# Patient Record
Sex: Male | Born: 1970 | Hispanic: Yes | Marital: Married | Smoking: Never smoker
Health system: Southern US, Community
[De-identification: ages and names within clinical notes are randomized; demographics above are authoritative.]

## PROBLEM LIST (undated history)

## (undated) DIAGNOSIS — N289 Disorder of kidney and ureter, unspecified: Secondary | ICD-10-CM

## (undated) DIAGNOSIS — I10 Essential (primary) hypertension: Secondary | ICD-10-CM

## (undated) DIAGNOSIS — E7800 Pure hypercholesterolemia, unspecified: Secondary | ICD-10-CM

---

## 2011-02-24 ENCOUNTER — Emergency Department (HOSPITAL_COMMUNITY)
Admission: EM | Admit: 2011-02-24 | Discharge: 2011-02-24 | Disposition: A | Discharge status: Home / Self Care | Payer: BC Managed Care – PPO | Attending: Emergency Medicine | Admitting: Emergency Medicine

## 2011-02-24 ENCOUNTER — Emergency Department (HOSPITAL_COMMUNITY): Payer: BC Managed Care – PPO

## 2011-02-24 DIAGNOSIS — R319 Hematuria, unspecified: Secondary | ICD-10-CM | POA: Insufficient documentation

## 2011-02-24 DIAGNOSIS — R51 Headache: Secondary | ICD-10-CM | POA: Insufficient documentation

## 2011-02-24 DIAGNOSIS — E119 Type 2 diabetes mellitus without complications: Secondary | ICD-10-CM | POA: Insufficient documentation

## 2011-02-24 DIAGNOSIS — R109 Unspecified abdominal pain: Secondary | ICD-10-CM | POA: Insufficient documentation

## 2011-02-24 DIAGNOSIS — R079 Chest pain, unspecified: Secondary | ICD-10-CM | POA: Insufficient documentation

## 2011-02-24 LAB — COMPREHENSIVE METABOLIC PANEL
ALT: 10 U/L (ref 0–53)
AST: 15 U/L (ref 0–37)
Alkaline Phosphatase: 77 U/L (ref 39–117)
GFR calc Af Amer: >60 mL/min (ref >60)
Glucose, Bld: 321 mg/dL — ABNORMAL HIGH (ref 70–99)
Potassium: 4.3 mEq/L (ref 3.5–5.1)
Sodium: 137 mEq/L (ref 135–145)
Total Protein: 6.7 g/dL (ref 6.0–8.3)

## 2011-02-24 LAB — CBC
HCT: 32.3 % — ABNORMAL LOW (ref 39.0–52.0)
MCHC: 36.5 g/dL — ABNORMAL HIGH (ref 30.0–36.0)
MCV: 82.4 fL (ref 78.0–100.0)
RDW: 12.9 % (ref 11.5–15.5)

## 2011-02-24 LAB — DIFFERENTIAL
Eosinophils Relative: 1 % (ref 0–5)
Lymphocytes Relative: 34 % (ref 12–46)
Lymphs Abs: 1.9 10*3/uL (ref 0.7–4.0)
Monocytes Absolute: 0.3 10*3/uL (ref 0.1–1.0)

## 2011-02-24 LAB — URINALYSIS, ROUTINE W REFLEX MICROSCOPIC
Bilirubin Urine: NEGATIVE
Protein, ur: >300 mg/dL — AB
Specific Gravity, Urine: 1.025 (ref 1.005–1.030)
Urobilinogen, UA: 0.2 mg/dL (ref 0.0–1.0)

## 2011-02-24 LAB — URINE MICROSCOPIC-ADD ON

## 2011-02-25 LAB — URINE CULTURE: Culture: NO GROWTH

## 2011-06-08 ENCOUNTER — Emergency Department (HOSPITAL_COMMUNITY): Payer: BC Managed Care – PPO

## 2011-06-08 ENCOUNTER — Emergency Department (HOSPITAL_COMMUNITY)
Admission: EM | Admit: 2011-06-08 | Discharge: 2011-06-08 | Disposition: A | Discharge status: Home / Self Care | Payer: BC Managed Care – PPO | Attending: Emergency Medicine | Admitting: Emergency Medicine

## 2011-06-08 DIAGNOSIS — R109 Unspecified abdominal pain: Secondary | ICD-10-CM | POA: Insufficient documentation

## 2011-06-08 DIAGNOSIS — Z79899 Other long term (current) drug therapy: Secondary | ICD-10-CM | POA: Insufficient documentation

## 2011-06-08 DIAGNOSIS — R52 Pain, unspecified: Secondary | ICD-10-CM | POA: Insufficient documentation

## 2011-06-08 DIAGNOSIS — E119 Type 2 diabetes mellitus without complications: Secondary | ICD-10-CM | POA: Insufficient documentation

## 2011-06-08 DIAGNOSIS — I10 Essential (primary) hypertension: Secondary | ICD-10-CM | POA: Insufficient documentation

## 2011-06-08 DIAGNOSIS — R197 Diarrhea, unspecified: Secondary | ICD-10-CM | POA: Insufficient documentation

## 2011-06-08 LAB — URINALYSIS, ROUTINE W REFLEX MICROSCOPIC
Bilirubin Urine: NEGATIVE
Glucose, UA: 250 mg/dL — AB
Protein, ur: >300 mg/dL — AB
Specific Gravity, Urine: 1.025 (ref 1.005–1.030)
Urobilinogen, UA: 0.2 mg/dL (ref 0.0–1.0)

## 2011-06-08 LAB — COMPREHENSIVE METABOLIC PANEL
ALT: 5 U/L (ref 0–53)
AST: 15 U/L (ref 0–37)
Alkaline Phosphatase: 78 U/L (ref 39–117)
CO2: 29 mEq/L (ref 19–32)
GFR calc Af Amer: >60 mL/min (ref >60)
GFR calc non Af Amer: 50 mL/min — ABNORMAL LOW (ref >60)
Glucose, Bld: 253 mg/dL — ABNORMAL HIGH (ref 70–99)
Potassium: 3.7 mEq/L (ref 3.5–5.1)
Sodium: 137 mEq/L (ref 135–145)

## 2011-06-08 LAB — CBC
Hemoglobin: 10.3 g/dL — ABNORMAL LOW (ref 13.0–17.0)
Platelets: 181 10*3/uL (ref 150–400)
RBC: 3.44 MIL/uL — ABNORMAL LOW (ref 4.22–5.81)
WBC: 6.1 10*3/uL (ref 4.0–10.5)

## 2011-06-08 LAB — URINE MICROSCOPIC-ADD ON

## 2011-06-08 LAB — DIFFERENTIAL
Basophils Absolute: 0 10*3/uL (ref 0.0–0.1)
Basophils Relative: 1 % (ref 0–1)
Eosinophils Absolute: 0.1 10*3/uL (ref 0.0–0.7)
Monocytes Relative: 5 % (ref 3–12)
Neutro Abs: 3.6 10*3/uL (ref 1.7–7.7)
Neutrophils Relative %: 59 % (ref 43–77)

## 2011-06-08 LAB — GLUCOSE, CAPILLARY: Glucose-Capillary: 249 mg/dL — ABNORMAL HIGH (ref 70–99)

## 2011-06-10 LAB — URINE CULTURE

## 2011-10-05 ENCOUNTER — Encounter: Payer: Self-pay | Admitting: *Deleted

## 2011-10-05 ENCOUNTER — Emergency Department (HOSPITAL_COMMUNITY): Payer: BC Managed Care – PPO

## 2011-10-05 ENCOUNTER — Emergency Department (HOSPITAL_COMMUNITY)
Admission: EM | Admit: 2011-10-05 | Discharge: 2011-10-05 | Disposition: A | Discharge status: Home / Self Care | Payer: BC Managed Care – PPO | Attending: Emergency Medicine | Admitting: Emergency Medicine

## 2011-10-05 DIAGNOSIS — H547 Unspecified visual loss: Secondary | ICD-10-CM | POA: Insufficient documentation

## 2011-10-05 DIAGNOSIS — M7989 Other specified soft tissue disorders: Secondary | ICD-10-CM | POA: Insufficient documentation

## 2011-10-05 DIAGNOSIS — R51 Headache: Secondary | ICD-10-CM | POA: Insufficient documentation

## 2011-10-05 DIAGNOSIS — R079 Chest pain, unspecified: Secondary | ICD-10-CM | POA: Insufficient documentation

## 2011-10-05 DIAGNOSIS — E119 Type 2 diabetes mellitus without complications: Secondary | ICD-10-CM | POA: Insufficient documentation

## 2011-10-05 DIAGNOSIS — D649 Anemia, unspecified: Secondary | ICD-10-CM | POA: Insufficient documentation

## 2011-10-05 DIAGNOSIS — N289 Disorder of kidney and ureter, unspecified: Secondary | ICD-10-CM | POA: Insufficient documentation

## 2011-10-05 DIAGNOSIS — I10 Essential (primary) hypertension: Secondary | ICD-10-CM | POA: Insufficient documentation

## 2011-10-05 DIAGNOSIS — E7800 Pure hypercholesterolemia, unspecified: Secondary | ICD-10-CM | POA: Insufficient documentation

## 2011-10-05 HISTORY — DX: Essential (primary) hypertension: I10

## 2011-10-05 HISTORY — DX: Disorder of kidney and ureter, unspecified: N28.9

## 2011-10-05 HISTORY — DX: Pure hypercholesterolemia, unspecified: E78.00

## 2011-10-05 LAB — COMPREHENSIVE METABOLIC PANEL
Alkaline Phosphatase: 72 U/L (ref 39–117)
BUN: 25 mg/dL — ABNORMAL HIGH (ref 6–23)
CO2: 25 mEq/L (ref 19–32)
Chloride: 107 mEq/L (ref 96–112)
Creatinine, Ser: 1.8 mg/dL — ABNORMAL HIGH (ref 0.50–1.35)
GFR calc Af Amer: 53 mL/min — ABNORMAL LOW (ref >90)
GFR calc non Af Amer: 46 mL/min — ABNORMAL LOW (ref >90)
Glucose, Bld: 145 mg/dL — ABNORMAL HIGH (ref 70–99)
Potassium: 4 mEq/L (ref 3.5–5.1)
Total Bilirubin: 0.3 mg/dL (ref 0.3–1.2)

## 2011-10-05 LAB — CBC
HCT: 23.4 % — ABNORMAL LOW (ref 39.0–52.0)
Hemoglobin: 8.3 g/dL — ABNORMAL LOW (ref 13.0–17.0)
MCV: 83.3 fL (ref 78.0–100.0)
WBC: 6.4 10*3/uL (ref 4.0–10.5)

## 2011-10-05 LAB — GLUCOSE, CAPILLARY

## 2011-10-05 MED ORDER — SODIUM CHLORIDE 0.9 % IV BOLUS (SEPSIS)
250.0000 mL | Freq: Once | INTRAVENOUS | Status: AC
  Administered 2011-10-05: 250 mL via INTRAVENOUS

## 2011-10-05 MED ORDER — SODIUM CHLORIDE 0.9 % IV SOLN
INTRAVENOUS | Status: DC
  Administered 2011-10-05: 17:00:00 via INTRAVENOUS

## 2011-10-05 MED ORDER — GADOBENATE DIMEGLUMINE 529 MG/ML IV SOLN
15.0000 mL | Freq: Once | INTRAVENOUS | Status: AC | PRN
  Administered 2011-10-05: 15 mL via INTRAVENOUS

## 2011-10-05 NOTE — ED Notes (Signed)
Pt reports bilateral leg swelling that started Saturday. Patient reports loss of vision in right eye and that he can only see red and blurred objects out of left eye. Patient in NAD at this time. Denies any needs at present.

## 2011-10-05 NOTE — ED Provider Notes (Addendum)
Scribed for Shelda Jakes, MD, the patient was seen in room APA03/APA03 . This chart was scribed by Ellie Lunch. This patient's care was started at 4:27 PM.   CSN: [id number] Arrival date & time: 10/05/2011  4:02 PM  Chief Complaint  Patient presents with  . visual disturbances   . Leg Swelling    (Consider location/radiation/quality/duration/timing/severity/associated sxs/prior treatment) A language interpreter was used.   Nathan Miles is a 40 y.o. male with a history of Diabetes who presents to the Emergency Department complaining of visual disturbances. Pt reports that he began to have trouble with his vision in his left eye beginning 4-5 months ago becoming progressively worse and resulting in total loss of vision in his left eye 2 days ago. Pt also reports starting 2 days ago his vision in his right eye became disturbed. He says he lost the ability to distinguish shapes and began to see red in his right eye. Pt says Sx are constant since onset, are severe, and are associated with frontal head pain. Pt reports that [facility] [place] [facility] time in 5/12 and was referred to an eye specialist in [place] where he received 2 laser surgeries to his left eye and 1 laser surgery to his right eye for possible diabetes related hemorrhage. His last visit to the specialist was 8/12.   Pt reports his diabetes has been under control recently. Today Pt saw his diabetes specialist Dr. Rayburn Felt this morning because he was concerned his vision loss was related to his DM. He was referred here to the ED for CT or MRI. He was also referred to an eye specialist.  Additionally reports welling in his legs for the past 2 weeks.  Past Medical History  Diagnosis Date  . Diabetes mellitus   . Hypertension   . High cholesterol   . Renal disorder     History reviewed. No pertinent past surgical history.  No family history on file.  History  Substance Use Topics  . Smoking  status: Never Smoker   . Smokeless tobacco: Not on file  . Alcohol Use: No    Review of Systems  Eyes: Positive for visual disturbance.  Cardiovascular: Positive for leg swelling.  Neurological: Positive for headaches.  All other systems reviewed and are negative.   Allergies  Review of patient's allergies indicates no known allergies.  Home Medications  No current outpatient prescriptions on file.  BP 133/81  Pulse 91  Temp(Src) 98.4 F (36.9 C) (Oral)  Resp 18  Ht 5\' 10"  (1.778 m)  Wt 160 lb (72.576 kg)  BMI 22.96 kg/m2  SpO2 100%  Physical Exam  Nursing note and vitals reviewed. Constitutional: He is oriented to person, place, and time. He appears well-developed and well-nourished.  HENT:  Head: Normocephalic and atraumatic.  Eyes:       Pupils 3mm in size. No reaction to light. Anterior chambers appear good. Able to track finger okay.  Not able to see any to visual any vessels.  No vision in left eye.   Neck: Neck supple.       No carotid bruit  Cardiovascular: Normal rate, regular rhythm, normal heart sounds and intact distal pulses.        Radial pulse 2+  Pulmonary/Chest: Effort normal and breath sounds normal.  Abdominal: Soft. There is no tenderness.  Musculoskeletal:       Minimal swelling in legs. No pitting edema. Pedal pulse 2+  Neurological: He is  alert and oriented to person, place, and time. Coordination normal.  Skin: Skin is warm and dry.   Procedures (including critical care time) OTHER DATA REVIEWED: Nursing notes, vital signs, and past medical records reviewed.  DIAGNOSTIC STUDIES: Oxygen Saturation is 100% on room air, normal by my interpretation.    LABS / RADIOLOGY:  Results for orders placed during the hospital encounter of 10/05/11  GLUCOSE, CAPILLARY      Component Value Range   Glucose-Capillary 156 (*) 70 - 99 (mg/dL)   Comment 1 Notify RN    CBC      Component Value Range   WBC 6.4  4.0 - 10.5 (K/uL)   RBC 2.81 (*) 4.22 -  5.81 (MIL/uL)   Hemoglobin 8.3 (*) 13.0 - 17.0 (g/dL)   HCT 21.3 (*) 08.6 - 52.0 (%)   MCV 83.3  78.0 - 100.0 (fL)   MCH 29.5  26.0 - 34.0 (pg)   MCHC 35.5  30.0 - 36.0 (g/dL)   RDW 57.8  46.9 - 62.9 (%)   Platelets 173  150 - 400 (K/uL)  COMPREHENSIVE METABOLIC PANEL      Component Value Range   Sodium 141  135 - 145 (mEq/L)   Potassium 4.0  3.5 - 5.1 (mEq/L)   Chloride 107  96 - 112 (mEq/L)   CO2 25  19 - 32 (mEq/L)   Glucose, Bld 145 (*) 70 - 99 (mg/dL)   BUN 25 (*) 6 - 23 (mg/dL)   Creatinine, Ser 5.28 (*) 0.50 - 1.35 (mg/dL)   Calcium 8.9  8.4 - 41.3 (mg/dL)   Total Protein 7.1  6.0 - 8.3 (g/dL)   Albumin 3.4 (*) 3.5 - 5.2 (g/dL)   AST 19  0 - 37 (U/L)   ALT 7  0 - 53 (U/L)   Alkaline Phosphatase 72  39 - 117 (U/L)   Total Bilirubin 0.3  0.3 - 1.2 (mg/dL)   GFR calc non Af Amer 46 (*) >90 (mL/min)   GFR calc Af Amer 53 (*) >90 (mL/min)   Dg Chest 2 View  10/05/2011  *RADIOLOGY REPORT*  Clinical Data: Shortness of breath, leg swelling.  CHEST - 2 VIEW  Comparison: 02/24/2011  Findings: Heart and mediastinal contours are within normal limits. No focal opacities or effusions.  No acute bony abnormality.  IMPRESSION: No active cardiopulmonary disease.  Original Report Authenticated By: Cyndie Chime, M.D.   Mr Laqueta Jean Wo Contrast  10/05/2011  *RADIOLOGY REPORT*  Clinical Data: Visual disturbance  MRI HEAD WITHOUT AND WITH CONTRAST  Technique:  Multiplanar, multiecho pulse sequences of the brain and surrounding structures were obtained according to standard protocol without and with intravenous contrast  Contrast: 15mL MULTIHANCE GADOBENATE DIMEGLUMINE 529 MG/ML IV SOLN  Comparison: None.  Findings: The examination suffers from considerable motion degradation.  Diffusion imaging does not show any acute or subacute infarction.  The brain appears normal on the motion degraded images.  No mass lesion, hemorrhage, hydrocephalus or extra-axial collection.  No pituitary mass.  Sinuses,  middle ears and mastoids are clear.  No abnormal contrast enhancement.  IMPRESSION: Motion degraded exam.  No pathologic finding.  Normal study allowing for motion.  Original Report Authenticated By: Thomasenia Sales, M.D.    ED COURSE / COORDINATION OF CARE: MDM:   SEEN BY HIS PROVIDER TODAY AND ARRANGEMENT MADE BY DR NIDA FOR EYE FOLLOW UP TOMORROW. HE HAS BEEN FOLLOWED BY RETINAL SPECIALIST IN THE PAST FOR RETINAL HEMMORAGES. TODAY DR NIDA Javier Docker  MRI OF BRAIN THAT WAS DONE AND NO SIG FINDINGS. LAB WORK UP WITHOUT SIG CHANGES EX FOR SOME WORSENING OF ANEMIA. HB NOT YET READY FOR TRANSFUSION. I SUSPECT VISUAL CHANGES OVER THE PAST SEVERAL WEEKS  AND THE 3 DAY OLD VISION LOSS RELATED TO SAME.  THEY UNDERSTAND IMPORTANCE OF SEEING THE EYE SPECIALIST.   CONFIRMED AT DISCHARGE THAT HE HAS APPOINTMENT TOMORROW WITH THE RETINAL SPECIALIST.   MEDICATIONS GIVEN IN THE E.D.  Medications  0.9 %  sodium chloride infusion (  Intravenous New Bag 10/05/11 1716)  sodium chloride 0.9 % bolus 250 mL (250 mL Intravenous Given 10/05/11 1715)  gadobenate dimeglumine (MULTIHANCE) injection 15 mL (15 mL Intravenous Contrast Given 10/05/11 1803)    SCRIBE ATTESTATION:   I personally performed the services described in this documentation, which was scribed in my presence. The recorded information has been reviewed and considered.         Shelda Jakes, MD 10/05/11 2219  Shelda Jakes, MD 10/11/11 [phone]

## 2011-10-05 NOTE — ED Notes (Signed)
C/o loss of vision in left eye, states right eye sees red x 2 days.  C/o swelling to bil legs x 2 wks.

## 2011-10-05 NOTE — ED Notes (Signed)
Patient remains in MRI 

## 2011-10-05 NOTE — ED Notes (Signed)
Pt left the er stating no needs 

## 2011-11-27 ENCOUNTER — Inpatient Hospital Stay: Payer: Self-pay | Admitting: *Deleted

## 2011-12-09 ENCOUNTER — Ambulatory Visit: Payer: Self-pay | Admitting: Ophthalmology

## 2011-12-11 ENCOUNTER — Ambulatory Visit: Payer: Self-pay | Admitting: Ophthalmology

## 2011-12-11 ENCOUNTER — Emergency Department: Payer: Self-pay | Admitting: *Deleted

## 2012-02-05 IMAGING — CR DG CHEST 2V
1 series · 2 of 2 positions shown · non-contrast
Comparison: none

REASON FOR EXAM: pain
COMMENTS:   May transport without cardiac monitor

[Series 3: x chest ap · 0.14mm/px · 2 of 2 slices shown]
[im 1/2]
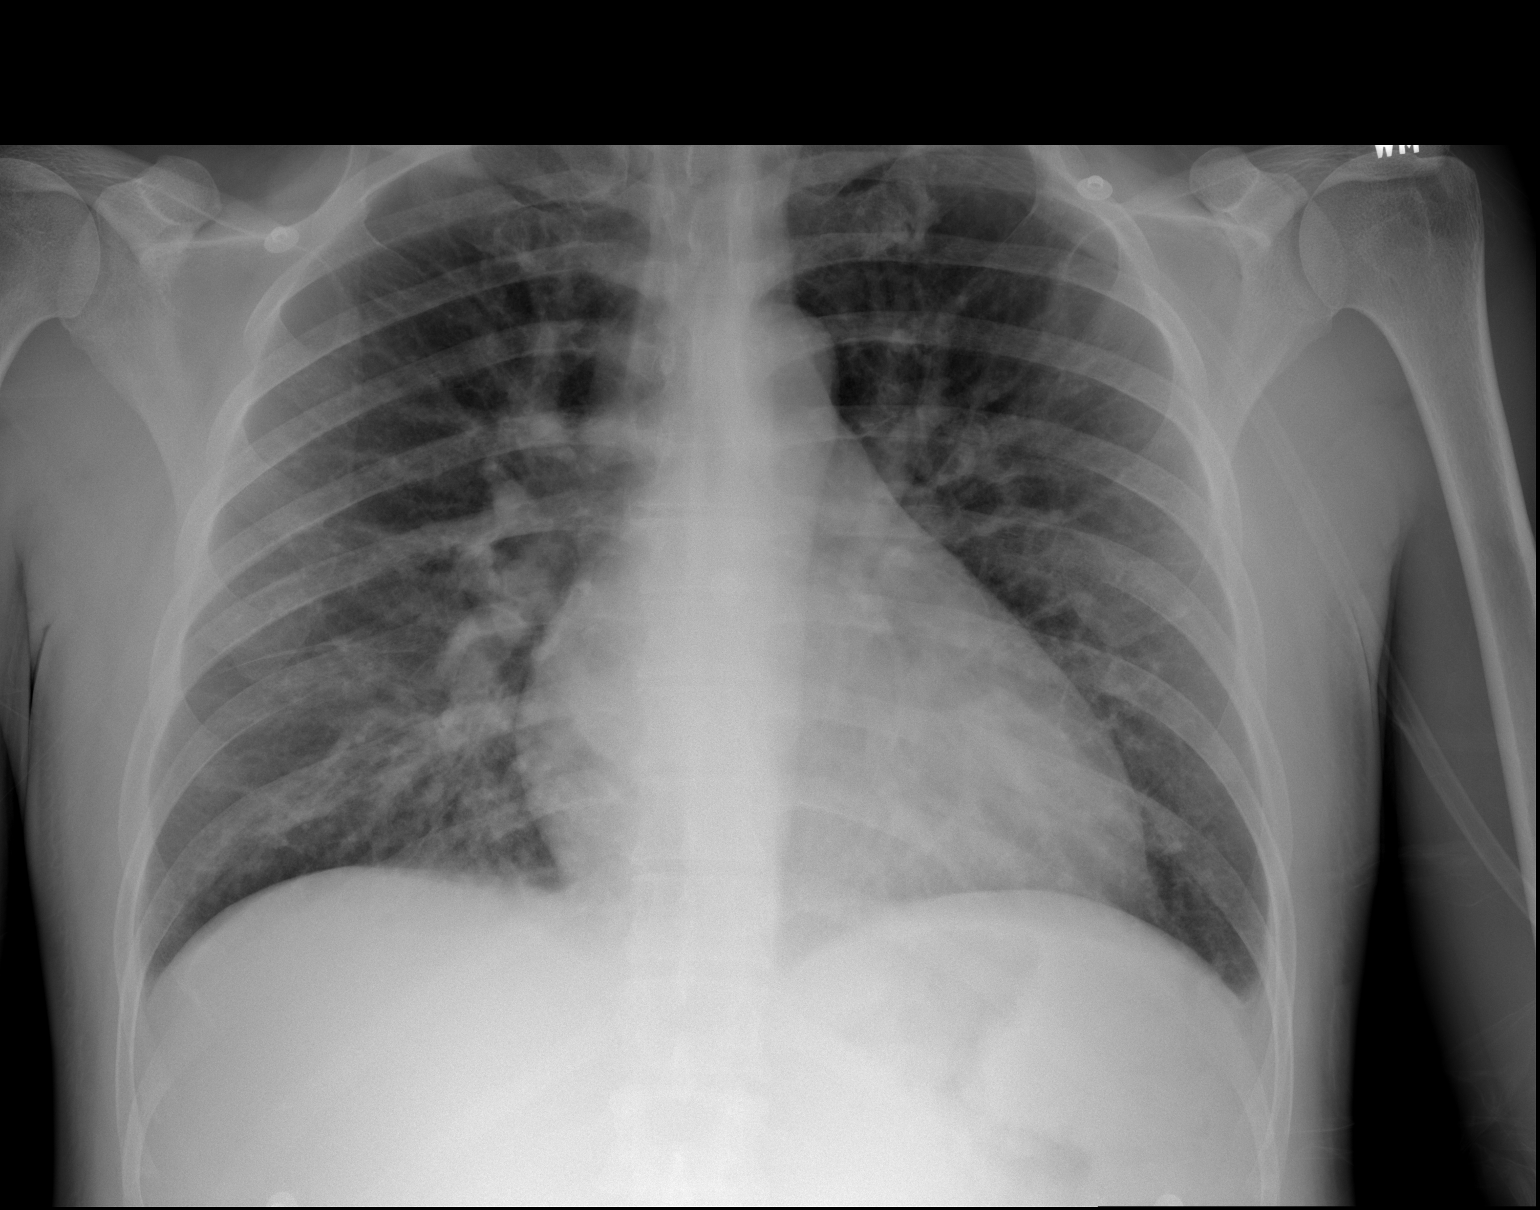
[im 2/2]
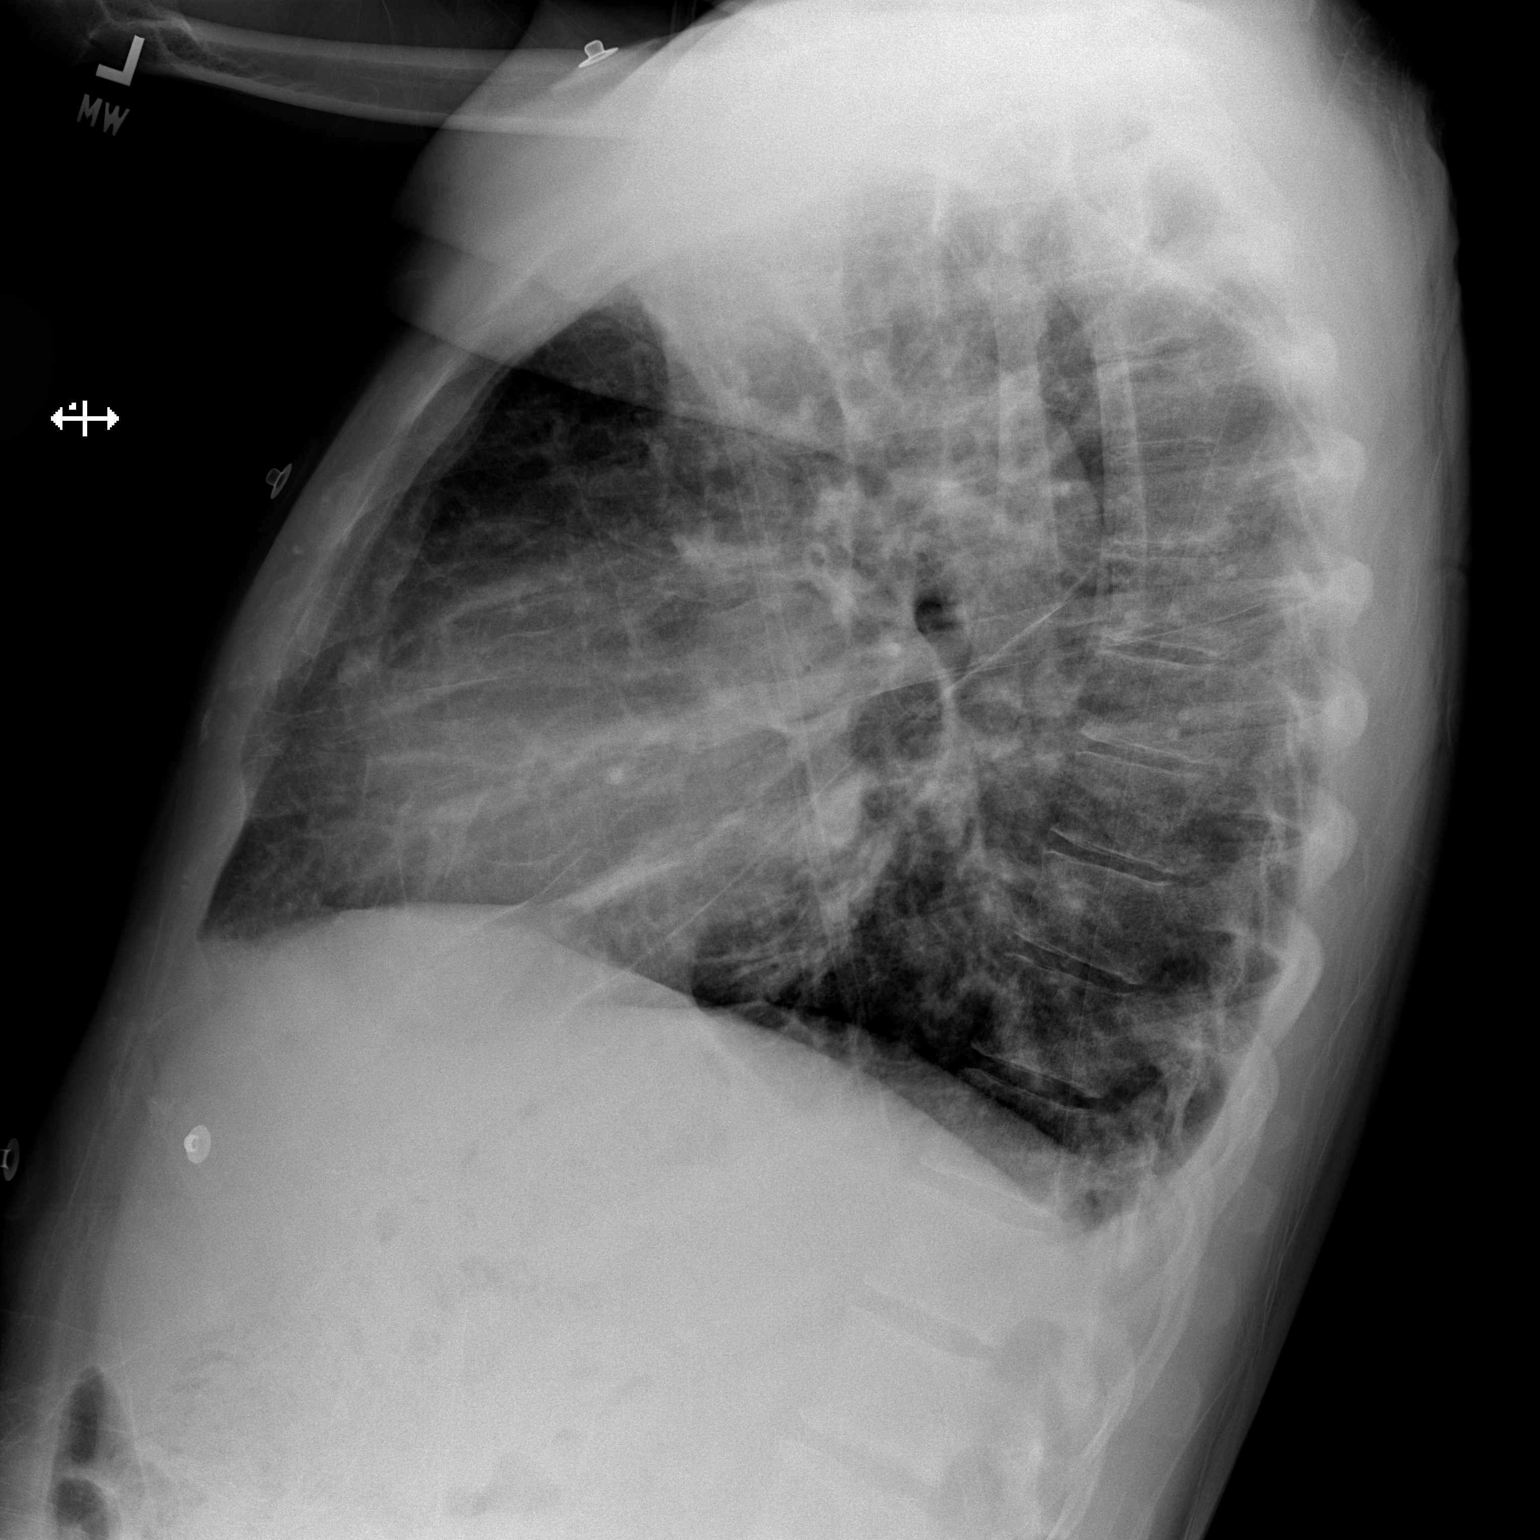

[2 of 2 positions shown; findings below may reference images not displayed]

PROCEDURE:     DXR - DXR CHEST PA (OR AP) AND LATERAL  - December 11, 2011  [DATE]

RESULT:     Comparison is made to the prior exam of 11/27/2011. No pneumonia
is seen. There is again noted prominence of the pulmonary vascularity
consistent with mild pulmonary vascular congestion. There is a trace of
inner lobar fluid. The heart is mildly enlarged. Early or low grade CHF
cannot be excluded. Note is made that noncardiac causes of pulmonary
vascular congestion such as diminished renal function, severe anemia or low
serum proteins could produce similar findings. The mediastinal and osseous
structures are normal in appearance. In the lateral view, the chest appears
hyperinflated which suggest a history of COPD or asthma.
IMPRESSION: 1. No pneumonia is seen.
2. There are changes compatible with pulmonary vascular congestion as noted
above.
3. In the lateral view, the chest appears hyperinflated which suggest a
history of COPD or asthma.

## 2012-02-25 ENCOUNTER — Inpatient Hospital Stay: Payer: Self-pay | Admitting: Internal Medicine

## 2012-02-25 LAB — URINALYSIS, COMPLETE
Bilirubin,UR: NEGATIVE
Glucose,UR: 50 mg/dL (ref 0–75)
Hyaline Cast: 1
Ketone: NEGATIVE
Leukocyte Esterase: NEGATIVE
Ph: 6 (ref 4.5–8.0)
Squamous Epithelial: <1

## 2012-02-25 LAB — TROPONIN I: Troponin-I: 0.06 ng/mL — ABNORMAL HIGH

## 2012-02-25 LAB — CBC
HCT: 24.8 % — ABNORMAL LOW (ref 40.0–52.0)
HGB: 8.1 g/dL — ABNORMAL LOW (ref 13.0–18.0)
MCV: 89 fL (ref 80–100)
RDW: 15.9 % — ABNORMAL HIGH (ref 11.5–14.5)
WBC: 6.6 10*3/uL (ref 3.8–10.6)

## 2012-02-25 LAB — COMPREHENSIVE METABOLIC PANEL
Albumin: 2.6 g/dL — ABNORMAL LOW (ref 3.4–5.0)
Alkaline Phosphatase: 63 U/L (ref 50–136)
Bilirubin,Total: 0.4 mg/dL (ref 0.2–1.0)
Calcium, Total: 8.2 mg/dL — ABNORMAL LOW (ref 8.5–10.1)
Co2: 20 mmol/L — ABNORMAL LOW (ref 21–32)
Creatinine: 2.57 mg/dL — ABNORMAL HIGH (ref 0.60–1.30)
EGFR (Non-African Amer.): 30 — ABNORMAL LOW
Glucose: 94 mg/dL (ref 65–99)
Potassium: 4.6 mmol/L (ref 3.5–5.1)
SGPT (ALT): 12 U/L

## 2012-02-25 LAB — LIPASE, BLOOD: Lipase: 102 U/L (ref 73–393)

## 2012-02-25 LAB — PROTIME-INR: Prothrombin Time: 14.4 secs (ref 11.5–14.7)

## 2012-02-25 LAB — CK TOTAL AND CKMB (NOT AT ARMC): CK-MB: 3.3 ng/mL (ref 0.5–3.6)

## 2012-02-26 LAB — BASIC METABOLIC PANEL
Anion Gap: 12 (ref 7–16)
BUN: 41 mg/dL — ABNORMAL HIGH (ref 7–18)
Calcium, Total: 8 mg/dL — ABNORMAL LOW (ref 8.5–10.1)
Creatinine: 3.12 mg/dL — ABNORMAL HIGH (ref 0.60–1.30)
EGFR (African American): 29 — ABNORMAL LOW
EGFR (Non-African Amer.): 24 — ABNORMAL LOW
Glucose: 106 mg/dL — ABNORMAL HIGH (ref 65–99)
Sodium: 144 mmol/L (ref 136–145)

## 2012-02-26 LAB — IRON AND TIBC
Iron Bind.Cap.(Total): 313 ug/dL (ref 250–450)
Iron Saturation: 11 %
Iron: 34 ug/dL — ABNORMAL LOW (ref 65–175)
Unbound Iron-Bind.Cap.: 279 ug/dL

## 2012-02-26 LAB — CBC WITH DIFFERENTIAL/PLATELET
Basophil #: 0.1 10*3/uL (ref 0.0–0.1)
Basophil %: 1 %
Eosinophil #: 0.1 10*3/uL (ref 0.0–0.7)
Lymphocyte %: 29.5 %
MCH: 30 pg (ref 26.0–34.0)
MCHC: 33 g/dL (ref 32.0–36.0)
Monocyte #: 0.3 10*3/uL (ref 0.0–0.7)
Neutrophil #: 3.3 10*3/uL (ref 1.4–6.5)
Neutrophil %: 61.4 %
RBC: 2.67 10*6/uL — ABNORMAL LOW (ref 4.40–5.90)
RDW: 17 % — ABNORMAL HIGH (ref 11.5–14.5)

## 2012-02-26 LAB — FERRITIN: Ferritin (ARMC): 152 ng/mL (ref 8–388)

## 2012-02-26 LAB — CK TOTAL AND CKMB (NOT AT ARMC)
CK, Total: 684 U/L — ABNORMAL HIGH (ref 35–232)
CK-MB: 2.6 ng/mL (ref 0.5–3.6)

## 2012-02-27 LAB — BASIC METABOLIC PANEL
Chloride: 109 mmol/L — ABNORMAL HIGH (ref 98–107)
Co2: 22 mmol/L (ref 21–32)
Creatinine: 3.17 mg/dL — ABNORMAL HIGH (ref 0.60–1.30)
EGFR (African American): 28 — ABNORMAL LOW
EGFR (Non-African Amer.): 23 — ABNORMAL LOW
Sodium: 144 mmol/L (ref 136–145)

## 2012-02-27 LAB — PROTEIN / CREATININE RATIO, URINE
Protein, Random Urine: 176 mg/dL — ABNORMAL HIGH (ref 0–12)
Protein/Creat. Ratio: 5192 mg/gCREAT — ABNORMAL HIGH (ref 0–200)

## 2012-02-29 LAB — UR PROT ELECTROPHORESIS, URINE RANDOM

## 2012-02-29 LAB — HEMOGLOBIN
HGB: 8.3 g/dL — ABNORMAL LOW (ref 13.0–18.0)
HGB: 7.5 g/dL — ABNORMAL LOW (ref 13.0–18.0)

## 2012-02-29 LAB — CREATININE, SERUM: EGFR (African American): 29 — ABNORMAL LOW

## 2012-03-01 LAB — CBC WITH DIFFERENTIAL/PLATELET
Basophil #: 0.1 10*3/uL (ref 0.0–0.1)
Eosinophil #: 0.2 10*3/uL (ref 0.0–0.7)
Eosinophil %: 2.3 %
HCT: 20.8 % — ABNORMAL LOW (ref 40.0–52.0)
Lymphocyte #: 1.5 10*3/uL (ref 1.0–3.6)
MCH: 29.8 pg (ref 26.0–34.0)
MCHC: 32.6 g/dL (ref 32.0–36.0)
MCV: 91 fL (ref 80–100)
Monocyte #: 0.4 10*3/uL (ref 0.0–0.7)
Monocyte %: 5.7 %
Neutrophil #: 4.7 10*3/uL (ref 1.4–6.5)
Platelet: 175 10*3/uL (ref 150–440)
RBC: 2.28 10*6/uL — ABNORMAL LOW (ref 4.40–5.90)
RDW: 16 % — ABNORMAL HIGH (ref 11.5–14.5)

## 2012-03-01 LAB — URINALYSIS, COMPLETE
Bilirubin,UR: NEGATIVE
Blood: NEGATIVE
Glucose,UR: 50 mg/dL (ref 0–75)
Hyaline Cast: 1
Leukocyte Esterase: NEGATIVE
Ph: 7 (ref 4.5–8.0)
Specific Gravity: 1.009 (ref 1.003–1.030)
Squamous Epithelial: NONE SEEN
WBC UR: 1 /HPF (ref 0–5)

## 2012-03-01 LAB — BASIC METABOLIC PANEL
Anion Gap: 5 — ABNORMAL LOW (ref 7–16)
BUN: 43 mg/dL — ABNORMAL HIGH (ref 7–18)
Chloride: 109 mmol/L — ABNORMAL HIGH (ref 98–107)
Creatinine: 3.41 mg/dL — ABNORMAL HIGH (ref 0.60–1.30)
EGFR (African American): 26 — ABNORMAL LOW
EGFR (Non-African Amer.): 22 — ABNORMAL LOW
Glucose: 136 mg/dL — ABNORMAL HIGH (ref 65–99)
Osmolality: 290 (ref 275–301)
Sodium: 139 mmol/L (ref 136–145)

## 2012-04-21 IMAGING — CR DG CHEST 2V
1 series · 2 of 2 positions shown · non-contrast
Comparison: none

REASON FOR EXAM: sob
COMMENTS:

PROCEDURE:     DXR - DXR CHEST PA (OR AP) AND LATERAL  - February 25, 2012  [DATE]
RESULT:     Comparison: 12/11/2011

[Series 1: w chest pa · 0.14mm/px · 2 of 2 slices shown]
[im 1/2]
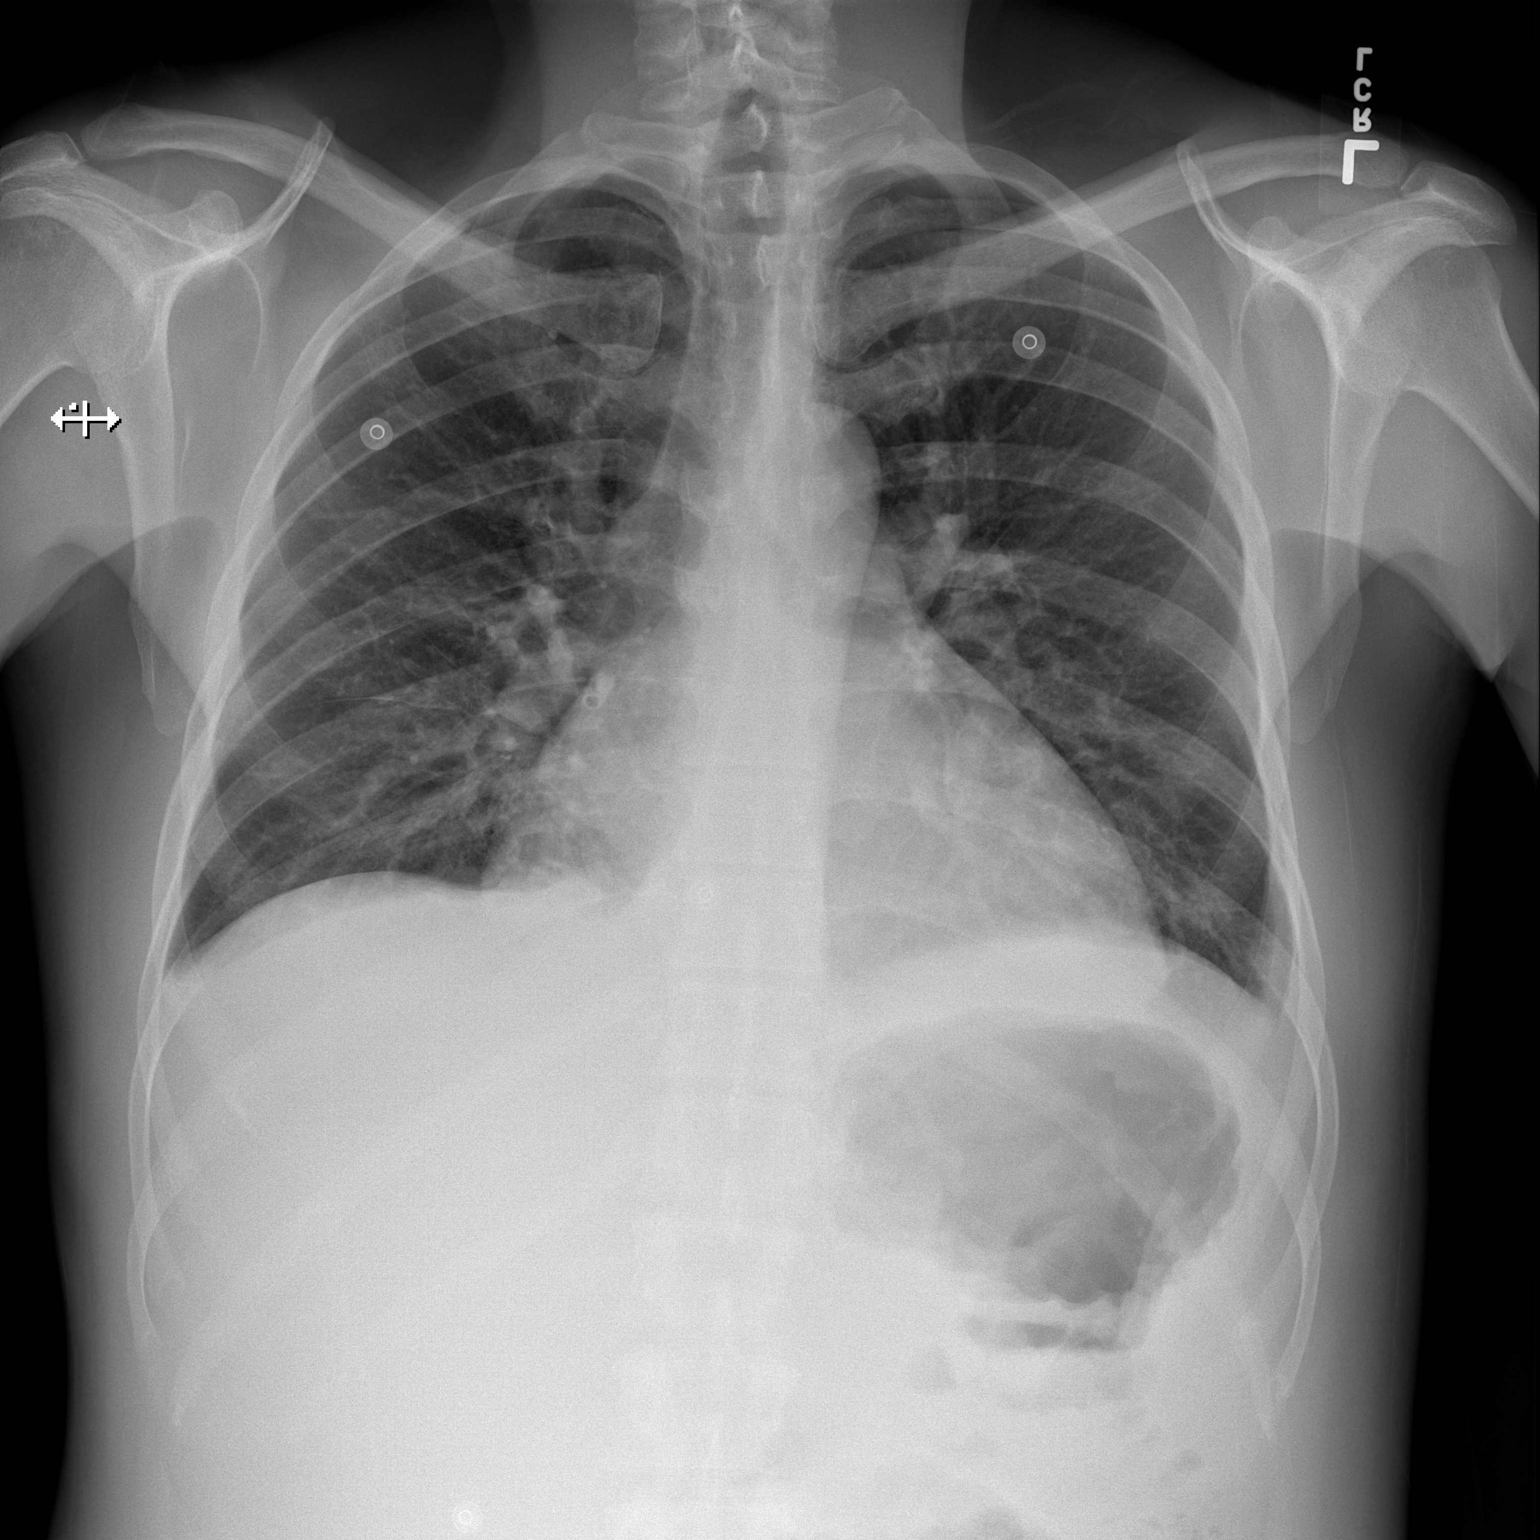
[im 2/2]
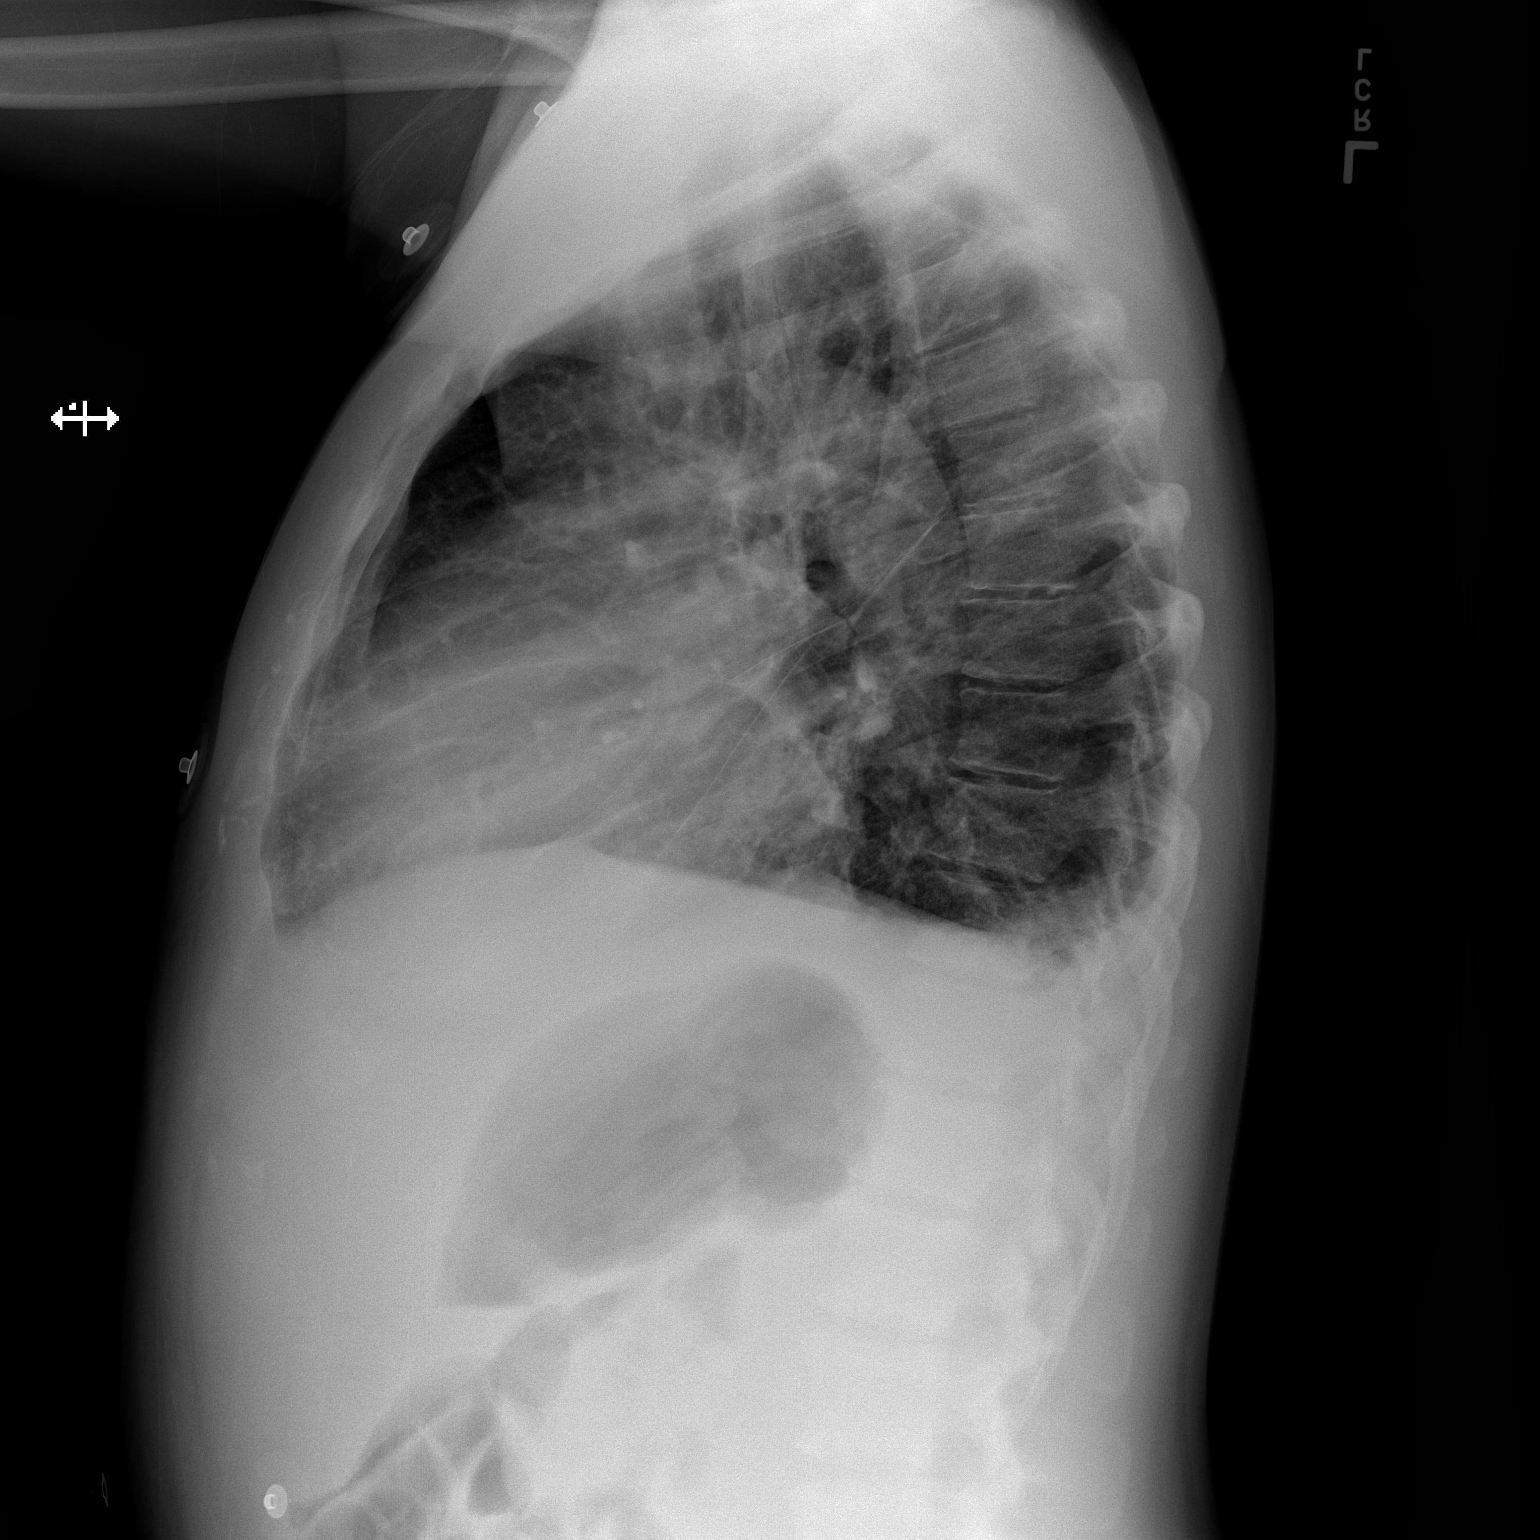

[2 of 2 positions shown; findings below may reference images not displayed]

FINDINGS: Heart size upper limits of normal to mildly enlarged, similar to prior.
There are mild bilateral interstitial opacities. Small bilateral pleural
effusions.
IMPRESSION: Mild interstitial pulmonary edema with small bilateral pleural effusions.

## 2015-04-14 NOTE — H&P (Signed)
PATIENT NAME:  Nathan Miles, Nathan Miles MR#:  [id number] DATE OF BIRTH:  [birth year]  DATE OF ADMISSION:  02/25/2012  PRIMARY CARE PHYSICIAN: None.  CHIEF COMPLAINT: Shortness of breath, lower extremity swelling and abdominal bloating.   HISTORY OF PRESENT ILLNESS: This is a 44 year old male who presents to the Emergency Room today due to progressive lower extremity swelling, abdominal bloating, and shortness of breath. The patient is strictly Spanish speaking, therefore, most of the history is obtained with the help of the interpreter at bedside. The patient apparently was discharged from the hospital a couple of months ago, in December 2012, and was treated for renal failure, some atypical chest pain, and hypertension. He was also noted to have some vision loss secondary to severe retinopathy. The patient said that over the past few weeks his lower extremities have gotten significantly swollen where it gets worse usually in the evenings and when he keeps his feet down. The patient also says that he has been having worsening exertional shortness of breath and has been getting up multiple times at night feeling short of breath. He cannot recall if he really has gained any weight. He also admits there is some atypical chest pain which comes along in the evenings usually when he is ambulating. He denies any fevers, denies any chills, and he denies any nausea and vomiting. A few weeks ago the patient had significant right eye pain and was being followed by Dr. Aron BabaMatthew Appenzeller from ophthalmology. 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 Postoperatively, the patient was still having significant right eye pain and [facility]. Postsurgery the patient was told not to take any of his blood pressure medications until he follows up with his primary care physician. He went to see his primary care physician but they would not see him and therefore he has  not been taking any of his blood pressure medications over the past month or so. He has had a follow-up [facility], which was a couple of days ago, and he seems to be doing fine from that standpoint presently. When the patient presented to the ER,  he was noted to be anasarcic and in congestive heart failure. Hospitalist services were then contacted for further treatment and evaluation.   REVIEW OF SYSTEMS: CONSTITUTIONAL: No documented fever. No weight gain and no weight loss. EYES: No blurry or double vision. ENT: No tinnitus or postnasal drip. No redness of the oropharynx. RESPIRATORY: No cough, no wheeze, and no hemoptysis. Positive dyspnea. CARDIOVASCULAR: Positive chest pain. Positive orthopnea. No palpitations or syncope. GI: No nausea, no vomiting, no diarrhea, no abdominal pain, and no melena or hematochezia. GU: No dysuria or hematuria. ENDOCRINE: No polyuria or nocturia. No heat or cold intolerance. HEME: No anemia, no bruising, and no bleeding. INTEGUMENTARY: No rashes and no lesions. MUSCULOSKELETAL: No arthritis, no swelling, and no gout. NEUROLOGIC: No numbness, no tingling, no ataxia, and no seizure activity. PSYCH: No anxiety, no insomnia, and no ADD.   PAST MEDICAL HISTORY:  1. 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 2. Chronic kidney disease. 3. Chronic anemia. 4. 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   ALLERGIES: No known drug allergies.   SOCIAL HISTORY: No smoking. No alcohol abuse. No illicit drug abuse. Lives at home with his wife.   FAMILY HISTORY: The patient's father died from a malignancy, suspected bone malignancy. He has  two brothers who are diabetic, although he is not diabetic.   CURRENT MEDICATIONS: He currently is only taking prednisolone eyedrops 1% four times daily.  PHYSICAL EXAMINATION:   VITAL SIGNS: Presently temperature is 97, pulse 85, respirations 14, blood pressure 181/119, and  saturations 97% on room air.   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   HEENT: Head atraumatic, normocephalic. His extraocular muscles are intact. His pupils are equal, round and reactive to light. Sclerae anicteric. No conjunctival injection. No pharyngeal erythema.   NECK: Supple. No jugular venous distention, no bruits, no lymphadenopathy, and no thyromegaly.   HEART: Regular rate and rhythm. No murmurs, no rubs, no clicks.   LUNGS: Clear to auscultation bilaterally. No rales, no rhonchi, and no wheezes.   ABDOMEN: Soft, flat, nontender, and nondistended. Good bowel sounds. No hepatosplenomegaly appreciated.   EXTREMITIES: He does have +2 to 3 pitting edema bilaterally from the knees to the ankles, +2 pedal and radial pulses bilaterally.   NEUROLOGIC: The patient is alert, awake, and oriented x3 with no focal motor or sensory deficits appreciated bilaterally.   SKIN: Moist and warm with no rash appreciated.   LYMPHATIC: There is no cervical or axillary lymphadenopathy.   LABS/STUDIES: Serum glucose 94, BUN 37, creatinine 2.5, sodium 143, potassium 4.6, chloride 110, bicarbonate 20. Liver function tests are within normal limits. Troponin 0.04. CK 689. White cell count 6.6, hemoglobin 8.1, hematocrit 44.8, platelet count 250. INR 1.1.   The patient did have an ultrasound of the abdomen done which shows mild sludge within the gallbladder, mild gallbladder wall thickening and pericholecystic fluid, but no sonographic Murphy's sign, bilateral pleural effusions, and a pericardial effusion, incompletely visualized.   ASSESSMENT AND PLAN: This is a 44 year old male with past medical history of nonischemic cardiomyopathy with an ejection fraction of 35 to 45%, narrow-angle glaucoma, CKD stage III, and chronic anemia who presented to the hospital due to shortness of breath, lower extremity edema, and also abdominal swelling. He was also noted to be in malignant  hypertension.  1. Acute congestive heart failure: This is likely the cause of the patient's shortness of breath and lower extremity edema. The patient apparently had not been taking any of his hypertensive medications plus he has an underlying cardiomyopathy and now here with malignant hypertension. I will aggressively diurese him with Lasix, start him on IV every 12 hours of 40 mg, and follow daily ins and outs and weights. We will follow his renal function closely as he has chronic kidney disease. We will start him on some beta blockers. We cannot give him an ACE inhibitor due to his chronic kidney disease. I will add some Norvasc, hydralazine, and nitroglycerin. We will get a cardiology consult. The patient is followed by Dr. Juliann Pares. I will go ahead and repeat a two-dimensional echocardiogram as there is some concern for possible pericardial effusion on the ultrasound findings as mentioned.  2. Malignant hypertension: The patient has not been taking any of his medications for the past month. His blood pressure is significantly elevated. I will go ahead and start him on some Coreg, Norvasc, and hydralazine. We will also use some p.r.n. hydralazine if needed, also had some Nitro-Dur patch. 3. Chronic kidney disease: Creatinine is at baseline. We will need to monitor this as the patient is going to be on diuretics. Renal dose his medications and avoid nephrotoxins. We will get a nephrology consult. The patient is followed by Dr. Cherylann Ratel.  4. Chronic anemia:  Hemoglobin presently is stable. No evidence of any bleeding. We will follow this closely. I think this anemia is secondary to chronic kidney disease.  5. Retinopathy with right-sided glaucoma: We will continue his prednisolone eyedrops.   CODE STATUS: The patient is a FULL CODE.  The plan was discussed with the patient and the patient's family and they are in agreement.   TIME SPENT: 50 minutes. ____________________________ Rolly Pancake. Cherlynn Kaiser,  MD vjs:slb D: 02/25/2012 11:37:12 ET T: 02/25/2012 11:56:20 ET JOB#: [id number]  cc: Rolly Pancake. Cherlynn Kaiser, MD, <Dictator> Houston Siren MD ELECTRONICALLY SIGNED 02/25/2012 14:31

## 2015-04-14 NOTE — Discharge Summary (Signed)
PATIENT NAME:  Nathan Miles, Nathan Miles MR#:  [id number] DATE OF BIRTH:  [birth year]  DATE OF ADMISSION:  11/27/2011 DATE OF DISCHARGE:  12/01/2011  DISCHARGE DIAGNOSES:  1. Chest pain, status post cardiac catheterization, nonischemic cardiomyopathy. 2. Acute renal failure, maybe now going into chronic kidney disease. 3. Anemia of chronic disease. 4. Hypertension. 5. 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   CONSULTATIONS:  1. Dorothyann Peng, MD - Cardiology. 2. Mosetta Pigeon, MD - Nephrology. 3. Mady Haagensen, MD - Nephrology. 4. Lockie Mola, MD - Ophthalmology.  PROCEDURES: Cardiac catheterization.   HISTORY OF PRESENT ILLNESS: This is a 44 year old male who was diagnosed with diabetes a few months ago. He presented with chest pain and vision changes. He was also complaining of some shortness of breath and lower extremity edema. He was admitted as possible unstable angina, also malignant hypertension, acute renal failure, and vision changes with possible cerebrovascular accident. When he came in his creatinine was found to be 2.27 with a BUN of 22 and a GFR of 35. We do not have any baseline creatinine on him. His albumin was only 2.9. His LFTs were normal. When he came in, his hemoglobin was 8.2 and had a normal MCV. He had a CT of the head done that was negative, because of vision loss. He had a MRI of the brain done without contrast for vision loss which did not show any acute ischemia. His ultrasound of the carotids showed no significant stenosis, antegrade flow in both vertebrals. He had a Doppler ultrasound of the lower extremity that was negative for deep vein thrombosis. His vision loss was not secondary to stroke. Ophthalmology was consulted and Dr. Inez Pilgrim saw him. He had bilateral vitreous hemorrhage, proliferative retinopathy. This resembles severe uncontrolled diabetes, but his hemoglobin A1c was found to be 3.5. He also has advanced ocular ischemia. The patient has  significant vision loss with only light perception in the right eye and maybe some hand movement in the left side. He is going to followup with [facility], Dr. Champ Mungo, as an outpatient. His cardiac enzymes were negative. He had an echocardiogram done which showed the patient has LV dysfunction with an ejection fraction of 35% to 45%, LV function is moderately reduced, moderate global hypokinesis, borderline concentric left ventricular hypertrophy and right ventricular function is normal. Considering that he has significant chronic kidney disease there was a suspicion for coronary artery disease. The patient was taken to the Cath Lab on 11/30/2011. The patient has minimal coronary artery disease. This is nonischemic cardiomyopathy with an ejection fraction of 35% to 40%. The patient does not have renal artery stenosis. Nephrology was consulted. Further kidney work-up showed that he had a urine protein creatinine ratio of around 3.2 grams, so he has nephrotic range proteinuria. He was started on an ACE inhibitor to help his proteinuria. He was given renal contrast protection with Mucomyst and bicarbonate drip for his catheterization and his kidney function is stable in the range of 2.2 to 2.45 with a GFR ranging from around 35. At discharge his creatinine is 2.2 and BUN of 24 with a GFR of 35. So this is most likely chronic kidney disease now. He also got 2 units of packed RBC transfusion and his hemoglobin has been in the range of 8.9. His blood cultures are all negative. His urinalysis showed that he had 2+ blood with his glucose only 50. He had an ANA panel done that was positive and anti-RNP antibodies were positive. He had an  ANCA panel that was negative. C3 levels were normal. His C4 levels were normal.  HIV antibodies were negative. He might need renal biopsy as an outpatient to determine the cause of his kidney disease, but it is highly suspected that the patient may have diabetes which causes  his retinopathy and nephropathy, although his hemoglobin A1c was found to be 3.5. The only way to explain that, after I talked to the nephrologist, that possibly his kidneys are getting worse, his diabetes is now controlled, and that is making his A1c less than 3.5. His phosphorus is on the high side at 6. His uric acid is normal. He initially had a Lexiscan done also prior to catheterization which was an adequate Lexiscan study with decreased uptake at stress compared to rest which could represent ischemia, but also depressed LV function. He recommended cardiac catheterization, but the patient already had a cardiac catheterization. His chest x-ray when he came in showed small bibasilar pleural effusions. His chest pain is resolved right now. He was started on aspirin also, but we will stop it right now because the patient may need a renal biopsy as an outpatient. He is going to followup his hemoglobin and kidney function as an outpatient when he follows with Dr. Thedore MinsSingh. He has been set up to followup with nephrology, cardiology, ophthalmology, and primary care.   DISCHARGE MEDICATIONS:  1. Metoprolol ER 50 mg p.o. once daily. 2. Amlodipine 5 mg once daily. 3. Lisinopril 10 mg once daily. 4. Pravastatin 20 mg at bedtime.  5. Omeprazole 20 mg daily.  NOTE: Do not take aspirin.  DIET:  I advised a low-sodium, low-cholesterol diet.   CONDITION AT DISCHARGE: Comfortable. T-max 98.2, heart rate 90, blood pressure 154/91, and saturating 97% on room air. Chest is clear. Heart sounds are regular. Abdomen is soft and nontender. He has significant vision loss.  DISCHARGE FOLLOWUP: The patient should follow up with Dr. Mosetta PigeonHarmeet Singh at [facility] in 1 week, followup with Dr. Juliann Paresallwood in 1 to 2 weeks, and followup with Dr. Aron BabaMatthew Appenzeller in 1 to 2 weeks at [facility]. I discussed with Dr. Inez PilgrimBrasington. Please make an appointment with your primary care physician, at [facility],  in 1 to 2 weeks.   TIME SPENT ON DISCHARGE: 60 minutes.  ____________________________ Fredia SorrowAbhinav Ilyas Lipsitz, MD ag:slb D: 12/01/2011 10:52:55 ET T: 12/01/2011 11:21:37 ET JOB#: [id number]  cc: Fredia SorrowAbhinav Joaquim Tolen, MD, <Dictator> Mosetta PigeonHarmeet Singh, MD Dwayne D. Juliann Paresallwood, MD Ignacia FellingMatthew F. Champ MungoAppenzeller, MD [facility] Fredia SorrowABHINAV Janina Trafton MD ELECTRONICALLY SIGNED 01/01/2012 11:51

## 2015-04-14 NOTE — Discharge Summary (Signed)
PATIENT NAME:  Nathan Miles, Nathan Miles MR#:  [id number] DATE OF BIRTH:  August 07, 1971  DATE OF ADMISSION:  02/25/2012 DATE OF DISCHARGE:  03/02/2012  PRESENTING COMPLAINT: Swelling of the legs and shortness of breath.   DISCHARGE DIAGNOSES:  1. Acute systolic congestive heart failure.  2. Type II diabetes.  3. Nephrotic syndrome with CKD stage III status post renal biopsy, results pending.  4. Hypertension.   CONDITION ON DISCHARGE: Fair.   PROCEDURE: Renal biopsy done on March 11th, pathology results pending, to be followed by Dr. Thedore Mins and Dr. Cherylann Ratel.   Sats on room air were 97%.   MEDICATIONS ON DISCHARGE:  1. Prednisolone acetate 1% ophthalmic suspension one drop to affected eye.  2. Tylenol 650 p.o. q.4 p.r.n.  3. Amlodipine 10 mg daily.  4. Ferrous sulfate 325 p.o. b.i.d.   5. Nephro-Vite 1 tablet p.o. daily.  6. Spironolactone 25 mg p.o. daily.  7. Coreg 12.5 mg p.o. b.i.d.  8. Hydralazine 50 mg q.i.d.  9. Lasix 20 mg p.o. daily.   FOLLOW-UP: 1. Follow-up with [facility]. The patient is to establish there next week.  2. Follow-up with Dr. Thedore Mins on Wednesday, March 20th at 10:30 a.m.    LABORATORY, DIAGNOSTIC, AND RADIOLOGICAL DATA: Hemoglobin 8. Urinalysis negative for urinary tract infection. White count 6.7, platelet count 175, glucose 136, BUN 43, creatinine 3.41, sodium 139, potassium 4.9, chloride 109, bicarb 25, calcium 7.8. Kidney biopsy results from [facility] shows nodular glomerulopathy, segmental glomerular sclerosis, moderate to severe interstitial fibrosis and tubular atrophy. No evidence of immune complex mediated glomerular nephritis. Hemoglobin A1c 5.5. Urine protein to creatinine ratio 5192. Serum iron 34, iron saturation 11, iron binding capacity 313. Troponin 0.06.   CONSULTATION: Nephrology, Dr. Thedore Mins   BRIEF SUMMARY OF HOSPITAL COURSE: Nathan Miles is a 44 year old Hispanic gentleman who has history of nonischemic cardiomyopathy with EF of 35 to 45%  with history of glaucoma who underwent recent surgery, who is legally blind, with CKD stage III/IV, and chronic anemia who came to the hospital with: 1. Shortness of breath and lower extremity edema. He was admitted with acute CHF, likely systolic, with ischemic cardiomyopathy, EF of 35 to 45%. He was initially started on IV Lasix. He diuresed well. This was changed to p.o. Lasix, low dose at home. Repeat 2-D echo showed EF of 25 to 30%, mild to moderate MR and TR. He was continued on beta-blockers. No ACE was given given his renal failure. Continued on Norvasc and hydralazine.  2. Malignant hypertension. The patient was not taking any medicine for the past one month. His medicines of Coreg, hydralazine, and Norvasc were resumed with much better control of blood pressure.  3. CKD, stage III/IV likely due to hypertension and diabetes, however, patient has not had any renal biopsy. His blood sugars are very well controlled along with his A1c of 5.5. After discussing with Nephrology, the patient underwent renal biopsy. Results as above were noted. He will resume his aspirin at discharge. Follow-up closely with his creatinine as outpatient with Dr. Thedore Mins and Dr. Cherylann Ratel.  4. Chronic anemia due to CKD stage III. Iron studies in December showed serum iron of 45. His hemoglobin was stable, however, after dialysis he required 1 unit of blood transfusion which was given to him. No hematuria was noted.  5. 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 Prednisolone eyedrops were continued. He will follow-up with his [facility] ophthalmologist as he is directed.   The patient will  get established with [facility]. Hospital stay otherwise remained stable.   CODE STATUS: The patient remained a FULL CODE.     TIME SPENT: 40 minutes.   ____________________________ Wylie HailSona A. Allena KatzPatel, MD sap:drc D: 03/04/2012 15:23:01 ET T: 03/04/2012 15:50:01 ET JOB#: [id number]  cc: Mylinh Cragg A. Allena KatzPatel,  MD, <Dictator> [facility] Mosetta PigeonHarmeet Singh, MD [place] Lizabeth LeydenN. Lateef, MD Willow OraSONA A Bernard Donahoo MD ELECTRONICALLY SIGNED 03/06/2012 13:40

## 2015-04-14 NOTE — Op Note (Signed)
PATIENT NAME:  Nathan Miles, Nathan Miles MR#:  [id number] DATE OF BIRTH:  28-Mar-1971  DATE OF PROCEDURE:  12/11/2011  PROCEDURES PERFORMED:  1. Pars plana vitrectomy of the right eye.  2. Silicone oil removal of the right eye.  3. Gas exchange of the right eye.   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   ESTIMATED BLOOD LOSS: Less than 1 mL.  PRIMARY SURGEON: Ignacia Felling. Magdalen Cabana, MD   ANESTHESIA: General endotracheal anesthesia with a supplemental retrobulbar block.   INDICATIONS FOR PROCEDURE: This is a patient who presented to my office today from the Emergency Room with pressure of over 70 in his right eye. Examination revealed that silicone oil had somehow tracked around his natural lens and into the anterior chamber and enclosed most of the angle. An inferior PI was done in the office with some decrease in pressure down into the high 50's. The patient became a little more comfortable and the patient maintained light perception vision which he had preoperatively. Risks, benefits, and alternatives of the above procedure were discussed and the patient wished to proceed on an emergent basis.   DETAILS OF PROCEDURE: After informed consent was obtained, the patient was brought into the operative suite at [facility]. The patient was placed in supine position, was induced by the anesthesia team without any complications. A retrobulbar block was performed on the right eye by the primary surgeon without any complications. Right eye was prepped and draped in sterile manner. After lid speculum was inserted, the original temporal conjunctival wound was opened up. The inferotemporal sclerotomy was reopened and trocar was inserted through this original wound and the infusion cannula was turned on and inserted through the trocar and secured in position with Steri-Strips. An 18-gauge  sclerotomy was created superotemporally. The silicone oil extractor was inserted through this wound and the oil was completely removed from the vitreous chamber. A second small corneal wound was created in a clear corneal fashion to allow for expression of all of the silicone oil on the anterior chamber. The anterior chamber remained formed during this period signifying that the patient has zonular weakness from some natural cause allowing communication between the posterior and anterior chambers. Once completed, the corneal wound was noted to be completely watertight. A third trocar was placed inferonasally through displaced conjunctiva in an oblique fashion. The light pipe and extraction cannula were inserted and an air-fluid exchange was performed. The retina was noted to remain flat during this entire time. Once the air exchange was completed, the inferonasal trocar was removed and the wound was closed using transconjunctival 6-0 plain gut. A 7-0 Vicryl stitch was preplaced through the superotemporal sclerotomy. 12% C3F8 was used as an Systems developer. The superotemporal wound was closed and the inferotemporal trocar was removed and the wound was closed using 7-0 Vicryl. C3F8 was used with a 30-gauge needle to pressurize the eye to approximately 10 to 15 mmHg. The conjunctiva was then closed using running 6-0 plain gut. The eye pressure was confirmed again to be approximately 10 to 15 mmHg. The lid speculum was removed and the eye was cleaned. TobraDex and Combigan were placed on the eye and a patch and shield was placed over the eye. The patient was reversed from anesthesia and taken to postanesthesia care with instructions to remain face down.   ____________________________ Ignacia Felling. Champ Mungo, MD mfa:drc D: 12/11/2011 13:40:23 ET T: 12/11/2011  15:33:47 ET JOB#: [id number]  cc: Ignacia FellingMatthew F. Champ MungoAppenzeller, MD, <Dictator> Cline CoolsMATTHEW F Jaylan Hinojosa MD ELECTRONICALLY SIGNED 12/25/2011 17:42

## 2015-04-14 NOTE — Op Note (Signed)
PATIENT NAME:  Nathan Miles, Nathan Miles MR#:  [id number] DATE OF BIRTH:  10-19-1971  DATE OF PROCEDURE:  12/09/2011  PREOPERATIVE DIAGNOSES:  1. Dense vitreous hemorrhage. 2. Proliferative diabetic retinopathy.  3. 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   POSTOPERATIVE DIAGNOSES:  1. Dense vitreous hemorrhage. 2. Proliferative diabetic retinopathy.  3. 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   PROCEDURES PERFORMED:  1. Pars plana vitrectomy of the right eye.  2. Scleral buckle of the right eye.  3. Endolaser of the right eye.  4. Gas exchange of the right eye.  5. Membrane peel of the right eye.  6. Tractional retinal detachment repair of the right eye.  7. Silicone oil injection of the right eye.   SURGEON: Aron BabaMatthew Roshni Burbano, MD  ESTIMATED BLOOD LOSS: Less than 1 mL.  ANESTHESIA: General anesthesia with a supplemental retrobulbar block.   COMPLICATIONS: None.   INDICATIONS FOR PROCEDURE: This is a patient who presented to my office with light perception and hand motion vision in both eyes. Examination of the left eye revealed a tractional retinal detachment and ultrasound of the right eye revealed a macula involving tractional retinal detachment. The risks, benefits, and alternatives of the above procedure were discussed and the patient wished to proceed.   DETAILS OF PROCEDURE: After informed consent was obtained, the patient was brought to the operative [facility]. The patient was placed in the supine position and was induced by the anesthesia team without any complications. The right eye was prepped and draped in sterile manner. After lid speculum was inserted, a 360 degree conjunctival peritomy was performed. The tenons capsule was incised off of the globe in all four quadrants. Each of the rectus muscles were isolated using 4-0 silk bridle sutures. 5-0 nylon sutures were placed in each of the four quadrants between the rectus  muscles in a horizontal mattress fashion. The anterior portion of each of the horizontal mattress sutures was 4 mm beyond the muscle insertion and the posterior aspect was 4 mm beyond that. A 42 encircling band was then passed  underneath each of the rectus muscles and through each of the horizontal mattress sutures. The ends were brought together using a 70 sleeve, in the inferotemporal quadrant. Each of the nylon sutures were then tied and the knots were rotated to the posterior aspect of the buckle. This was done after the buckle was pulled up tight. Once each of the nylon sutures were tied, the ends of the buckle were cut and attention was turned to the pars plana vitrectomy portion of the case.  A 23-gauge trocar was placed 4 mm beyond the limbus in an oblique fashion in the inferotemporal quadrant. The infusion was turned on and the vitrector was put through the trocar and the eye was decompressed. The infusion cannula was then inserted through the trocar and secured in position with Steri-Strips. Two more trocars were placed in a similar fashion, superotemporally and superonasally. The vitreous cutter and light pipe were introduced in the eye and the vitreous hemorrhage was carefully removed moving from anterior to posterior until the tractional retinal detachment was isolated. Anterior and posterior vitreous were dissected away from each other and the vitreous was trimmed for 360 degrees. A combination of forceps, vertical scissors, and vitrector were used to isolate and peel the tractional retinal detachment in its entirety. The retina was noted to be extremely loose secondary to its tractional retinal detachment. Two small tears were identified and marked using  Endo cautery. Perfluoron was injected into the posterior to flatten the retina and was brought up to the level of the anterior buckle. Endolaser was introduced in the eye and 360 degrees of panretinal photocoagulation was performed and care was  taken to put four rows of laser around each of the retinal breaks. Once this was completed, an air-fluid exchange was performed and then the Perfluoron was completely removed. The retina was noted to be completely flat and 360 degree panretinal photocoagulation was completed. Once this was done any remnant fluid was removed from the posterior. 5000 centistoke silicone oil was then injected into the vitreous chamber up to the level of the lens. Each of the trocars were removed and the wounds were closed using interrupted 7-0 Vicryl sutures. The buckle was then rinsed using neomycin and a supplemental retrobulbar block was provided. The conjunctiva was pulled up and the conjunctiva was closed using running 6-0 plain gut suture. 10 mg of dexamethasone was given into the inferior fornix and the lid speculum was removed. Pressure in the eye was confirmed to be approximately 15 mmHg. The eye was cleaned and TobraDex was placed on the eye. A patch and shield were placed over the eye. The patient was reversed from anesthesia and taken to postanesthesia care with instructions to remain face down.  ____________________________ Ignacia Felling. Champ Mungo, MD mfa:slb D: 12/09/2011 11:31:37 ET T: 12/09/2011 12:00:05 ET JOB#: [id number]  cc: Ignacia Felling. Champ Mungo, MD, <Dictator> Cline Cools MD ELECTRONICALLY SIGNED 12/25/2011 17:42
